# Patient Record
Sex: Female | Born: 1990 | Race: White | Hispanic: No | Marital: Single | Smoking: Current every day smoker
Health system: Southern US, Community
[De-identification: ages and names within clinical notes are randomized; demographics above are authoritative.]

## PROBLEM LIST (undated history)

## (undated) HISTORY — PX: HERNIA REPAIR: SHX51

## (undated) HISTORY — PX: TONSILLECTOMY: SUR1361

## (undated) HISTORY — PX: EYE SURGERY: SHX253

---

## 2011-06-13 ENCOUNTER — Emergency Department: Payer: Self-pay | Admitting: Emergency Medicine

## 2017-01-16 ENCOUNTER — Encounter: Payer: Self-pay | Admitting: Emergency Medicine

## 2017-01-16 ENCOUNTER — Ambulatory Visit
Admission: EM | Admit: 2017-01-16 | Discharge: 2017-01-16 | Disposition: A | Discharge status: Home / Self Care | Payer: Self-pay | Attending: Emergency Medicine | Admitting: Emergency Medicine

## 2017-01-16 DIAGNOSIS — J069 Acute upper respiratory infection, unspecified: Secondary | ICD-10-CM

## 2017-01-16 DIAGNOSIS — J9801 Acute bronchospasm: Secondary | ICD-10-CM

## 2017-01-16 MED ORDER — DEXAMETHASONE 4 MG PO TABS: ORAL_TABLET | ORAL | 0 refills | Status: DC

## 2017-01-16 MED ORDER — IBUPROFEN 800 MG PO TABS: 800.0000 mg | ORAL_TABLET | Freq: Three times a day (TID) | ORAL | 0 refills | Status: DC

## 2017-01-16 MED ORDER — ALBUTEROL SULFATE HFA 108 (90 BASE) MCG/ACT IN AERS: 1.0000 | INHALATION_SPRAY | Freq: Four times a day (QID) | RESPIRATORY_TRACT | 0 refills | Status: DC | PRN

## 2017-01-16 MED ORDER — GUAIFENESIN-CODEINE 100-10 MG/5ML PO SYRP: 10.0000 mL | ORAL_SOLUTION | Freq: Four times a day (QID) | ORAL | 0 refills | Status: DC | PRN

## 2017-01-16 MED ORDER — AEROCHAMBER PLUS MISC: 2 refills | Status: DC

## 2017-01-16 MED ORDER — FLUTICASONE PROPIONATE 50 MCG/ACT NA SUSP: 2.0000 | Freq: Every day | NASAL | 0 refills | Status: DC

## 2017-01-16 NOTE — Discharge Instructions (Signed)
Take two puffs from your albuterol inhaler every 4 hours. Finish the steroids unless your doctor tells you to stop. You may take tylenol 1 gram up to 3 times a day as needed for pain. This with 800 mg of motrin is an effective combination for pain and fever. Make sure you drink extra fluids.   Go to www.goodrx.com to look up your medications. This will give you a list of where you can find your prescriptions at the most affordable prices.

## 2017-01-16 NOTE — ED Provider Notes (Signed)
HPI  SUBJECTIVE:  Kaitlyn Buckley is a 26 y.o. female who presents with wheezing chest tightness, shortness of breath. States her chest hurts secondary to the cough. States that she has a cough productive of whitish clear sputum for the past 4 days. She reports sore throat secondary to the cough. She denies nasal congestion, postnasal drip. She states that she has rhinorrhea with a cough. She states that she feels feverish but she has no documented fevers at home. She is some body aches patient had a headache which resolved last night. She reports 2 episodes of posttussive emesis last night. States that she is unable to sleep at night secondary to cough. She works at [employer] and is not sure if she has had any contacts with the flu. She did not get a flu shot this year. No antipyretic in the past 6-8 hours. She tried ibuprofen with improvement in her symptoms. She also tried Mucinex. Symptoms are worse when she "feels hot". She denies neck stiffness, photophobia, no abdominal pain and urinary complaints here pain, sinus pain or pressure current skin infection. She reports a nonpainful, nonpruritic, faint facial rash but denies rash elsewhere. 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 She is a smoker with a six-year pack year history of smoking. No history of asthma, emphysema, COPD. She has history of weakness/ cerebral palsy in the right side. she is status post tonsillectomy. No history diabetes, hypertension, and a couple minus, kidney disease, cardiac disease. LMP: 3 weeks ago. PMD: None.    History reviewed. No pertinent past medical history.  Past Surgical History:  Procedure Laterality Date  . EYE SURGERY    . HERNIA REPAIR    . TONSILLECTOMY      History reviewed. No pertinent family history.  Social History  Substance Use Topics  . Smoking status: Current Every Day Smoker    Types: Cigarettes  . Smokeless tobacco: Never Used  . Alcohol use Yes    No  current facility-administered medications for this encounter.   Current Outpatient Prescriptions:  .  albuterol (PROVENTIL HFA;VENTOLIN HFA) 108 (90 Base) MCG/ACT inhaler, Inhale 1-2 puffs into the lungs every 6 (six) hours as needed for wheezing or shortness of breath., Disp: 1 Inhaler, Rfl: 0 .  dexamethasone (DECADRON) 4 MG tablet, 4 tablets (16 mg) po at once on day 1 and 4 tabs (16 mg) po at once on day 2, Disp: 8 tablet, Rfl: 0 .  fluticasone (FLONASE) 50 MCG/ACT nasal spray, Place 2 sprays into both nostrils daily., Disp: 16 g, Rfl: 0 .  guaiFENesin-codeine (CHERATUSSIN AC) 100-10 MG/5ML syrup, Take 10 mLs by mouth 4 (four) times daily as needed for cough., Disp: 120 mL, Rfl: 0 .  ibuprofen (ADVIL,MOTRIN) 800 MG tablet, Take 1 tablet (800 mg total) by mouth 3 (three) times daily., Disp: 30 tablet, Rfl: 0 .  Spacer/Aero-Holding Chambers (AEROCHAMBER PLUS) inhaler, Use as instructed, Disp: 1 each, Rfl: 2  No Known Allergies   ROS  As noted in HPI.   Physical Exam  BP (!) 145/90 (BP Location: Left Arm)   Pulse (!) 106   Temp 99.7 F (37.6 C) (Oral)   Resp 16   Ht 5' (1.524 m)   Wt 170 lb (77.1 kg)   LMP 12/30/2016 (Approximate)   SpO2 99%   BMI 33.20 kg/m   Constitutional: Well developed, well nourished, no acute distress Eyes: PERRL, EOMI, conjunctiva normal bilaterally HENT: Normocephalic, atraumatic,mucus membranes moist. TMs  normal bilaterally. Positive clear rhinorrhea with erythematous, swollen turbinates. Positive cobblestoning, postnasal drip. Tonsils surgically absent. Uvula midline, normal size.  Respiratory: Fair air movement, diffuse expiratory wheezing throughout all lung fields. No rales, rhonchi Cardiovascular: Mild regular tachycardia, no murmurs, no gallops, no rubs GI: nondistended,  skin: No rash, skin intact Musculoskeletal: , no deformities Neurologic: Alert & oriented x 3, CN II-XII grossly intact, no motor deficits, sensation grossly  intact Psychiatric: Speech and behavior appropriate   ED Course   Medications - No data to display  No orders of the defined types were placed in this encounter.  No results found for this or any previous visit (from the past 24 hour(s)). No results found.  ED Clinical Impression  Upper respiratory tract infection, unspecified type  Bronchospasm   ED Assessment/Plan  Presentation most consistent with an upper respiratory infection with resulting bronchospasm. Suspect that she has a component of asthma or COPD given the history of "bad lungs" in childhood and her smoking. She is out of the window for Tamiflu. Do not think that she needs an x-ray today. She is afebrile, has no focal lung findings, satting well on room air. She is in no respiratory distress. Plan to send home with albuterol with spacer, 16 mg of dexamethasone by mouth for 2 days to treat a presumed bronchospasm/asthma exacerbation, ibuprofen 800 mg as needed for body aches, Cheratussin, Flonase. Will provide primary care referral. Also writing work note for today and tomorrow. Discussed MDM, plan and followup with patient and  parent. Discussed sn/sx that should prompt return to the UC or ER. Patient agrees with plan.   Meds ordered this encounter  Medications  . Spacer/Aero-Holding Chambers (AEROCHAMBER PLUS) inhaler    Sig: Use as instructed    Dispense:  1 each    Refill:  2  . guaiFENesin-codeine (CHERATUSSIN AC) 100-10 MG/5ML syrup    Sig: Take 10 mLs by mouth 4 (four) times daily as needed for cough.    Dispense:  120 mL    Refill:  0  . fluticasone (FLONASE) 50 MCG/ACT nasal spray    Sig: Place 2 sprays into both nostrils daily.    Dispense:  16 g    Refill:  0  . albuterol (PROVENTIL HFA;VENTOLIN HFA) 108 (90 Base) MCG/ACT inhaler    Sig: Inhale 1-2 puffs into the lungs every 6 (six) hours as needed for wheezing or shortness of breath.    Dispense:  1 Inhaler    Refill:  0  . ibuprofen (ADVIL,MOTRIN)  800 MG tablet    Sig: Take 1 tablet (800 mg total) by mouth 3 (three) times daily.    Dispense:  30 tablet    Refill:  0  . dexamethasone (DECADRON) 4 MG tablet    Sig: 4 tablets (16 mg) po at once on day 1 and 4 tabs (16 mg) po at once on day 2    Dispense:  8 tablet    Refill:  0    *This clinic note was created using Scientist, clinical (histocompatibility and immunogenetics)Dragon dictation software. Therefore, there may be occasional mistakes despite careful proofreading.  ?   Domenick GongAshley Kwesi Sangha, MD 01/16/17 [phone]

## 2017-01-16 NOTE — ED Triage Notes (Signed)
Patient c/o cough, chest congestion, bodyaches and fever for the past 3-4 days.

## 2017-01-21 ENCOUNTER — Encounter: Payer: Self-pay | Admitting: Gynecology

## 2017-01-21 ENCOUNTER — Ambulatory Visit
Admission: EM | Admit: 2017-01-21 | Discharge: 2017-01-21 | Disposition: A | Discharge status: Home / Self Care | Payer: Self-pay | Attending: Family Medicine | Admitting: Family Medicine

## 2017-01-21 DIAGNOSIS — J0100 Acute maxillary sinusitis, unspecified: Secondary | ICD-10-CM

## 2017-01-21 DIAGNOSIS — K0889 Other specified disorders of teeth and supporting structures: Secondary | ICD-10-CM

## 2017-01-21 MED ORDER — AMOXICILLIN-POT CLAVULANATE 875-125 MG PO TABS: 1.0000 | ORAL_TABLET | Freq: Two times a day (BID) | ORAL | 0 refills | Status: DC

## 2017-01-21 NOTE — ED Triage Notes (Signed)
Patient c/o right ear pain/ headache/ tooth sensitively x 3 days.

## 2017-01-21 NOTE — ED Provider Notes (Signed)
[facility] ____________________________________________  Time seen: Approximately 4:10 PM  I have reviewed the triage vital signs and the nursing notes.   HISTORY  Chief Complaint URI   HPI Kaitlyn Buckley is a 26 y.o. female  presenting with mother at bedside for the complaints of right upper dental pain and right ear pain. Patient reports that she recently had upper respiratory infection in which treated with cough medicines and inhalers as well as prednisone. Patient reports cough and congestion better, but reports she has continued to have sinus pressure and postnasal sinus drainage. Patient reports the last 3 days she has also had intermittent right upper tooth discomfort that radiates to right ear. Denies grinding teeth. Denies trauma. Denies fevers. Reports overall continues to eat and drink well. Denies recent antibiotic use.Denies fevers.  Denies chest pain, shortness of breath, abdominal pain, dysuria, paresthesias, extremity pain, extremity swelling or rash. Denies recent antibiotic use.   Patient's last menstrual period was 12/30/2016 (approximate). Denies pregnancy.  History reviewed. No pertinent past medical history. 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 Denies renal insufficiency. Denies cardiac history or diabetes. There are no active problems to display for this patient.   Past Surgical History:  Procedure Laterality Date  . EYE SURGERY    . HERNIA REPAIR    . TONSILLECTOMY       No current facility-administered medications for this encounter.   Current Outpatient Prescriptions:  .  albuterol (PROVENTIL HFA;VENTOLIN HFA) 108 (90 Base) MCG/ACT inhaler, Inhale 1-2 puffs into the lungs every 6 (six) hours as needed for wheezing or shortness of breath., Disp: 1 Inhaler, Rfl: 0 .  fluticasone (FLONASE) 50 MCG/ACT nasal spray, Place 2 sprays into both nostrils daily.,  Disp: 16 g, Rfl: 0 .  ibuprofen (ADVIL,MOTRIN) 800 MG tablet, Take 1 tablet (800 mg total) by mouth 3 (three) times daily., Disp: 30 tablet, Rfl: 0 .  Spacer/Aero-Holding Chambers (AEROCHAMBER PLUS) inhaler, Use as instructed, Disp: 1 each, Rfl: 2 .  amoxicillin-clavulanate (AUGMENTIN) 875-125 MG tablet, Take 1 tablet by mouth every 12 (twelve) hours., Disp: 20 tablet, Rfl: 0 .  dexamethasone (DECADRON) 4 MG tablet, 4 tablets (16 mg) po at once on day 1 and 4 tabs (16 mg) po at once on day 2, Disp: 8 tablet, Rfl: 0 .  guaiFENesin-codeine (CHERATUSSIN AC) 100-10 MG/5ML syrup, Take 10 mLs by mouth 4 (four) times daily as needed for cough., Disp: 120 mL, Rfl: 0  Allergies Patient has no known allergies.  No family history on file.  Social History Social History  Substance Use Topics  . Smoking status: Current Every Day Smoker    Types: Cigarettes  . Smokeless tobacco: Never Used  . Alcohol use Yes    Review of Systems Constitutional: No fever/chills Eyes: No visual changes. ENT: Has occasional mild sore throat. Cardiovascular: Denies chest pain. Respiratory: Denies shortness of breath. Gastrointestinal: No abdominal pain.  No nausea, no vomiting.  No diarrhea.  No constipation. Genitourinary: Negative for dysuria. Musculoskeletal: Negative for back pain. Skin: Negative for rash. Neurological: Negative for headaches, focal weakness or numbness.  10-point ROS otherwise negative.  ____________________________________________   PHYSICAL EXAM:  VITAL SIGNS: ED Triage Vitals  Enc Vitals Group     BP 01/21/17 1520 (!) 145/91     Pulse Rate 01/21/17 1520 79     Resp 01/21/17 1520 16     Temp 01/21/17 1520 98.2 F (36.8 C)  Temp Source 01/21/17 1520 Oral     SpO2 01/21/17 1520 98 %     Weight 01/21/17 1522 180 lb (81.6 kg)     Height --      Head Circumference --      Peak Flow --      Pain Score 01/21/17 1524 9     Pain Loc --      Pain Edu? --      Excl. in GC? --      Constitutional: Alert and oriented. Well appearing and in no acute distress. Eyes: Conjunctivae are normal. PERRL. EOMI. Head: Atraumatic.Mild to moderate tenderness to palpation bilateral maxillary sinuses, right greater than left. No frontal sinuses palpation. No swelling. No erythema. No facial swelling noted. No facial paresthesias. No trismus.  Ears: no erythema, normal TMs bilaterally. No surrounding tenderness, swelling or erythema bilaterally.  Nose: No nasal congestion or rhinorrhea.  Mouth/Throat: Mucous membranes are moist.  Oropharynx non-erythematous.No tonsillar swelling or exudate. Right upper wisdom tooth with surrounding mild to moderate tenderness to palpation and mild gum line swelling, no palpated or visualized abscess, no drainage. No trismus. No visualized cary or dental fracture. Neck: No stridor.  No cervical spine tenderness to palpation. Hematological/Lymphatic/Immunilogical: No cervical lymphadenopathy. Cardiovascular: Normal rate, regular rhythm. Grossly normal heart sounds.  Good peripheral circulation. Respiratory: Normal respiratory effort.  No retractions. No wheezes, rales or rhonchi. Good air movement.  Gastrointestinal: Soft and nontender.  No CVA tenderness. Musculoskeletal: No lower or upper extremity tenderness nor edema. No cervical, thoracic or lumbar tenderness to palpation.  Neurologic:  Normal speech and language. No gait instability. Skin:  Skin is warm, dry and intact. No rash noted. Psychiatric: Mood and affect are normal. Speech and behavior are normal.  ___________________________________________   LABS (all labs ordered are listed, but only abnormal results are displayed)  Labs Reviewed - No data to display   PROCEDURES Procedures   ____________________________________________   INITIAL IMPRESSION / ASSESSMENT AND PLAN / ED COURSE  Pertinent labs & imaging results that were available during my care of the patient were reviewed by me  and considered in my medical decision making (see chart for details).  Well-appearing patient. No acute distress. Patient with recent upper respiratory infection symptoms, with sinus tenderness, report of sinus drainage as well as right upper dental tenderness. Discussed with patient concern for secondary infection. Will treat patient with oral Augmentin. Encouraged supportive care, follow with primary care physician or Ear Nose Throat this week. Discussed rest and fluids, patient states she'll take over-the-counter ibuprofen as needed. Discussed indication, risks and benefits of medications with patient.  Discussed follow up with Primary care physician this week. Discussed follow up and return parameters including no resolution or any worsening concerns. Patient verbalized understanding and agreed to plan.   ____________________________________________   FINAL CLINICAL IMPRESSION(S) / ED DIAGNOSES  Final diagnoses:  Pain, dental  Acute maxillary sinusitis, recurrence not specified     Discharge Medication List as of 01/21/2017  4:02 PM    START taking these medications   Details  amoxicillin-clavulanate (AUGMENTIN) 875-125 MG tablet Take 1 tablet by mouth every 12 (twelve) hours., Starting Sat 01/21/2017, Normal        Note: This dictation was prepared with Dragon dictation along with smaller phrase technology. Any transcriptional errors that result from this process are unintentional.         Renford Dills, NP 01/21/17 1817

## 2017-01-21 NOTE — Discharge Instructions (Signed)
Take medication as prescribed. Rest. Drink plenty of fluids.   Follow up with your primary care physician or Ear, Nose and throat this week. Return to Urgent care or Emergency room for new or worsening concerns.

## 2017-06-14 ENCOUNTER — Ambulatory Visit
Admission: EM | Admit: 2017-06-14 | Discharge: 2017-06-14 | Disposition: A | Discharge status: Home / Self Care | Payer: Self-pay | Attending: Family Medicine | Admitting: Family Medicine

## 2017-06-14 DIAGNOSIS — L03116 Cellulitis of left lower limb: Secondary | ICD-10-CM

## 2017-06-14 DIAGNOSIS — L818 Other specified disorders of pigmentation: Secondary | ICD-10-CM

## 2017-06-14 MED ORDER — CEFTRIAXONE SODIUM 1 G IJ SOLR
1.0000 g | Freq: Once | INTRAMUSCULAR | Status: AC
Start: 2017-06-14 — End: 2017-06-14
  Administered 2017-06-14: 1 g via INTRAMUSCULAR

## 2017-06-14 MED ORDER — SULFAMETHOXAZOLE-TRIMETHOPRIM 800-160 MG PO TABS: 1.0000 | ORAL_TABLET | Freq: Two times a day (BID) | ORAL | 0 refills | Status: AC

## 2017-06-14 MED ORDER — MUPIROCIN 2 % EX OINT: TOPICAL_OINTMENT | CUTANEOUS | 0 refills | Status: DC

## 2017-06-14 MED ORDER — TETANUS-DIPHTH-ACELL PERTUSSIS 5-2.5-18.5 LF-MCG/0.5 IM SUSP
0.5000 mL | Freq: Once | INTRAMUSCULAR | Status: AC
  Administered 2017-06-14: 0.5 mL via INTRAMUSCULAR

## 2017-06-14 MED ORDER — TETANUS-DIPHTHERIA TOXOIDS TD 5-2 LFU IM INJ: 0.5000 mL | INJECTION | Freq: Once | INTRAMUSCULAR | Status: DC

## 2017-06-14 NOTE — ED Triage Notes (Signed)
[age] year old Caucasian female is here with her mother with complaints of tattoo infection on her left lower leg. Patient states she tattoo herself on 06/01/17. She states she started noticing the area was getting worse yesterday when she noticed pus oozing from it. She states she has been using Hydrogen Peroxide as well. She states the black ink came off 4 days ago. She states she feels like she went to deep with the tattooing.

## 2017-06-14 NOTE — Discharge Instructions (Signed)
Take medication as prescribed. Keep clean. Drink plenty of fluids.  ° °Follow up with your primary care physician this week as needed. Return to Urgent care for new or worsening concerns.  ° °

## 2017-06-14 NOTE — ED Provider Notes (Signed)
[facility] ____________________________________________  Time seen: Approximately 11:12 AM  I have reviewed the triage vital signs and the nursing notes.   HISTORY  Chief Complaint Cellulitis   HPI Kaitlyn CASARES is a 26 y.o. female presents for evaluation of left lower leg possible infection. Patient reports on 06/01/2017 she used a tattoo pen to tattoo herself. Patient reports initially this was going well. Reports about 4 or 5 days ago the area began to have some skin, off, use and become red. States had some drainage yesterday. Denies drainage today. States mild tender directly at site of tattoo, denies any other pain or swelling. Denies any paresthesias, decreased range of motion or injury otherwise. Unsure of last tetanus immunization. States tried some over-the-counter topical antibiotic without any changes. Denies fevers. Reports continues to eat and drink well and has continued to remain active. Denies other complaints. Denies aggravating or alleviating factors. Denies other alleviating measures taken.Denies other skin changes. Denies insect bite or tick bite or attachment.  Denies chest pain, shortness of breath. Denies recent sickness. Denies recent antibiotic use. Denies MRSA history or skin infection history.  Patient's last menstrual period was 06/01/2017. Denies pregnancy   History reviewed. No pertinent past medical history. denies There are no active problems to display for this patient.   Past Surgical History:  Procedure Laterality Date  . EYE SURGERY    . HERNIA REPAIR    . TONSILLECTOMY        Current Facility-Administered Medications:  .  cefTRIAXone (ROCEPHIN) injection 1 g, 1 g, Intramuscular, Once, Renford Dills, NP .  Tdap (BOOSTRIX) injection 0.5 mL, 0.5 mL, Intramuscular, Once, Renford Dills, NP  Current Outpatient Prescriptions:  .  ibuprofen (ADVIL,MOTRIN) 800 MG tablet, Take 1 tablet (800 mg total) by mouth 3 (three) times  daily., Disp: 30 tablet, Rfl: 0 .  albuterol (PROVENTIL HFA;VENTOLIN HFA) 108 (90 Base) MCG/ACT inhaler, Inhale 1-2 puffs into the lungs every 6 (six) hours as needed for wheezing or shortness of breath., Disp: 1 Inhaler, Rfl: 0 .  fluticasone (FLONASE) 50 MCG/ACT nasal spray, Place 2 sprays into both nostrils daily., Disp: 16 g, Rfl: 0 .  mupirocin ointment (BACTROBAN) 2 %, Apply three times a day for 7 days., Disp: 22 g, Rfl: 0 .  Spacer/Aero-Holding Chambers (AEROCHAMBER PLUS) inhaler, Use as instructed, Disp: 1 each, Rfl: 2 .  sulfamethoxazole-trimethoprim (BACTRIM DS,SEPTRA DS) 800-160 MG tablet, Take 1 tablet by mouth 2 (two) times daily., Disp: 20 tablet, Rfl: 0  Allergies Patient has no known allergies.  History reviewed. No pertinent family history.  Social History Social History  Substance Use Topics  . Smoking status: Current Every Day Smoker    Types: Cigarettes  . Smokeless tobacco: Current User  . Alcohol use Yes     Comment: occ    Review of Systems Constitutional: No fever/chills Eyes: No visual changes. ENT: No sore throat. Cardiovascular: Denies chest pain. Respiratory: Denies shortness of breath. Gastrointestinal: No abdominal pain.  No nausea, no vomiting.  No diarrhea.  No constipation. Genitourinary: Negative for dysuria. Musculoskeletal: Negative for back pain. Skin: As above.  ____________________________________________   PHYSICAL EXAM:  VITAL SIGNS: ED Triage Vitals  Enc Vitals Group     BP 06/14/17 1036 (!) 151/95     Pulse Rate 06/14/17 1036 85     Resp 06/14/17 1036 16     Temp 06/14/17 1036 98.2 F (36.8 C)     Temp Source 06/14/17 1036 Oral     SpO2  06/14/17 1036 100 %     Weight 06/14/17 1040 180 lb (81.6 kg)     Height 06/14/17 1040 4' 10.5" (1.486 m)     Head Circumference --      Peak Flow --      Pain Score 06/14/17 1040 4     Pain Loc --      Pain Edu? --      Excl. in GC? --     Constitutional: Alert and oriented. Well  appearing and in no acute distress.  Cardiovascular: Normal rate, regular rhythm. Grossly normal heart sounds.  Good peripheral circulation. Respiratory: Normal respiratory effort without tachypnea nor retractions. Breath sounds are clear and equal bilaterally. No wheezes, rales, rhonchi. Musculoskeletal:  Steady gait. Neurologic:  Normal speech and language. Speech is normal. No gait instability.  Skin:  Skin is warm, dry. Except: left medial lower ankle appearance of heart shaped tattoo with crusted centered lesions with minimal honey colored crusting, no active drainage, no fluctuance, no induration, mild to moderate immediate surrounding erythema, no further Surrounding erythema, erythema non circumferential, minimal tenderness to palpation at wound site, left lower extremity otherwise nontender, left foot normal sensation and distal pedal pulses, no calf tenderness or other skin changes noted to left leg.Ambulatory with steady gait. Psychiatric: Mood and affect are normal. Speech and behavior are normal. Patient exhibits appropriate insight and judgment   ___________________________________________   LABS (all labs ordered are listed, but only abnormal results are displayed)  Labs Reviewed - No data to display  RADIOLOGY  No results found. ____________________________________________   PROCEDURES Procedures    INITIAL IMPRESSION / ASSESSMENT AND PLAN / ED COURSE  Pertinent labs & imaging results that were available during my care of the patient were reviewed by me and considered in my medical decision making (see chart for details).  Appearing patient. No acute distress. Patient recently tried to self tattoo at home now with appearance of secondary skin infection. Unsure of last tetanus immunization, tetanus given. Suspect localized cellulitis from tattoo. 1 g IM Rocephin given once in urgent care. Will treat with topical Bactroban and oral Bactrim. Encouraged supportive care,  wound maintenance and close monitoring.Discussed indication, risks and benefits of medications with patient.  Discussed follow up with Primary care physician this week. Discussed follow up and return parameters including no resolution or any worsening concerns. Patient verbalized understanding and agreed to plan.   ____________________________________________   FINAL CLINICAL IMPRESSION(S) / ED DIAGNOSES  Final diagnoses:  Cellulitis of left lower leg  Tattoo of skin     New Prescriptions   MUPIROCIN OINTMENT (BACTROBAN) 2 %    Apply three times a day for 7 days.   SULFAMETHOXAZOLE-TRIMETHOPRIM (BACTRIM DS,SEPTRA DS) 800-160 MG TABLET    Take 1 tablet by mouth 2 (two) times daily.    Note: This dictation was prepared with Dragon dictation along with smaller phrase technology. Any transcriptional errors that result from this process are unintentional.         Renford DillsMiller, Jacinta Penalver, NP 06/14/17 1158

## 2018-04-09 ENCOUNTER — Other Ambulatory Visit: Payer: Self-pay

## 2018-04-09 ENCOUNTER — Ambulatory Visit
Admission: EM | Admit: 2018-04-09 | Discharge: 2018-04-09 | Disposition: A | Discharge status: Home / Self Care | Payer: Self-pay | Attending: Family Medicine | Admitting: Family Medicine

## 2018-04-09 DIAGNOSIS — K529 Noninfective gastroenteritis and colitis, unspecified: Secondary | ICD-10-CM

## 2018-04-09 MED ORDER — ONDANSETRON 4 MG PO TBDP: 4.0000 mg | ORAL_TABLET | Freq: Three times a day (TID) | ORAL | 0 refills | Status: AC | PRN

## 2018-04-09 NOTE — ED Triage Notes (Signed)
Pt with nausea and vomited some yesterday. No vomiting today. Diarrhea started last pm. And is watery. Feels bloated. States something similar happened a month ago but resolved on its own. Last meal before the sx started was beef tips, lima beans, and mashed potatoes. Body feels achy 7/10

## 2018-04-09 NOTE — ED Provider Notes (Signed)
[facility]    CSN: [id number] Arrival date & time: 04/09/18  1428   History   Chief Complaint Chief Complaint  Patient presents with  . Diarrhea   HPI  27 year old female presents with nausea, vomiting, diarrhea.  Started yesterday.  Began with nausea and vomiting.  Vomiting has now resolved.  She is now bothered by diarrhea & crampy abdominal pain, body aches.  Watery.  Associated bloating.  Diarrhea is improving and the stool is becoming more formed.  No fever documented, however she has felt feverish.  She is able to drink but has not eaten very much.  No known exacerbating factors.  No other associated symptoms.  No other complaints.   PMH: Obesity, Tobacco abuse.  Past Surgical History:  Procedure Laterality Date  . EYE SURGERY    . HERNIA REPAIR    . TONSILLECTOMY     OB History   None    Home Medications    Prior to Admission medications   Medication Sig Start Date End Date Taking? Authorizing Provider  albuterol (PROVENTIL HFA;VENTOLIN HFA) 108 (90 Base) MCG/ACT inhaler Inhale 1-2 puffs into the lungs every 6 (six) hours as needed for wheezing or shortness of breath. 01/16/17   Domenick Gong, MD  ondansetron (ZOFRAN-ODT) 4 MG disintegrating tablet Take 1 tablet (4 mg total) by mouth every 8 (eight) hours as needed for nausea or vomiting. 04/09/18   Tommie Sams, DO   Family History History reviewed. No pertinent family history.  Social History Social History   Tobacco Use  . Smoking status: Current Every Day Smoker    Packs/day: 1.00    Types: Cigarettes  . Smokeless tobacco: Never Used  Substance Use Topics  . Alcohol use: Yes    Comment: occ  . Drug use: No   Allergies   Amoxicillin  Review of Systems Review of Systems  Constitutional: Positive for appetite change. Negative for fever.  Gastrointestinal: Positive for abdominal pain, diarrhea, nausea and vomiting.   Physical Exam Triage Vital Signs ED Triage Vitals  Enc Vitals  Group     BP 04/09/18 1450 (!) 144/97     Pulse Rate 04/09/18 1450 (!) 115     Resp 04/09/18 1450 20     Temp 04/09/18 1450 98.5 F (36.9 C)     Temp Source 04/09/18 1450 Oral     SpO2 04/09/18 1450 100 %     Weight 04/09/18 1446 165 lb (74.8 kg)     Height 04/09/18 1446  (1.499 m)     Head Circumference --      Peak Flow --      Pain Score 04/09/18 1446 7     Pain Loc --      Pain Edu? --      Excl. in GC? --    Updated Vital Signs BP (!) 144/97 (BP Location: Left Arm)   Pulse (!) 115   Temp 98.5 F (36.9 C) (Oral)   Resp 20   Ht  (1.499 m)   Wt 165 lb (74.8 kg)   LMP 03/26/2018   SpO2 100%   BMI 33.33 kg/m   Physical Exam  Constitutional: She is oriented to person, place, and time. She appears well-developed. No distress.  HENT:  Mouth/Throat: Oropharynx is clear and moist.  Cardiovascular: Regular rhythm.  Tachycardic.  Pulmonary/Chest: Effort normal and breath sounds normal.  Abdominal: Soft. She exhibits no distension.  Diffusely tender to palpation (mild).  Neurological: She is alert  and oriented to person, place, and time.  Psychiatric: She has a normal mood and affect. Her behavior is normal.  Nursing note and vitals reviewed.  UC Treatments / Results  Labs (all labs ordered are listed, but only abnormal results are displayed) Labs Reviewed - No data to display  EKG None  Radiology No results found.  Procedures Procedures (including critical care time)  Medications Ordered in UC Medications - No data to display  Initial Impression / Assessment and Plan / UC Course  I have reviewed the triage vital signs and the nursing notes.  Pertinent labs & imaging results that were available during my care of the patient were reviewed by me and considered in my medical decision making (see chart for details).    27 year old female presents with viral gastroenteritis. Treating with Zofran. Rest, fluids. Work note given.    Final Clinical  Impressions(s) / UC Diagnoses   Final diagnoses:  Gastroenteritis   Discharge Instructions   None    ED Prescriptions    Medication Sig Dispense Auth. Provider   ondansetron (ZOFRAN-ODT) 4 MG disintegrating tablet Take 1 tablet (4 mg total) by mouth every 8 (eight) hours as needed for nausea or vomiting. 20 tablet Tommie Sams, DO     Controlled Substance Prescriptions [place] Controlled Substance Registry consulted? Not Applicable   Tommie Sams, DO 04/09/18 1548

## 2019-04-10 ENCOUNTER — Other Ambulatory Visit: Payer: Self-pay

## 2019-04-10 ENCOUNTER — Emergency Department: Payer: HRSA Program

## 2019-04-10 ENCOUNTER — Emergency Department
Admission: EM | Admit: 2019-04-10 | Discharge: 2019-04-10 | Disposition: A | Discharge status: Home / Self Care | Payer: HRSA Program | Attending: Emergency Medicine | Admitting: Emergency Medicine

## 2019-04-10 DIAGNOSIS — J4521 Mild intermittent asthma with (acute) exacerbation: Secondary | ICD-10-CM | POA: Diagnosis not present

## 2019-04-10 DIAGNOSIS — F17210 Nicotine dependence, cigarettes, uncomplicated: Secondary | ICD-10-CM | POA: Insufficient documentation

## 2019-04-10 DIAGNOSIS — R6889 Other general symptoms and signs: Secondary | ICD-10-CM

## 2019-04-10 DIAGNOSIS — Z20828 Contact with and (suspected) exposure to other viral communicable diseases: Secondary | ICD-10-CM | POA: Diagnosis not present

## 2019-04-10 DIAGNOSIS — R059 Cough, unspecified: Secondary | ICD-10-CM

## 2019-04-10 DIAGNOSIS — R05 Cough: Secondary | ICD-10-CM | POA: Diagnosis present

## 2019-04-10 DIAGNOSIS — Z20822 Contact with and (suspected) exposure to covid-19: Secondary | ICD-10-CM

## 2019-04-10 MED ORDER — ALBUTEROL SULFATE HFA 108 (90 BASE) MCG/ACT IN AERS: 1.0000 | INHALATION_SPRAY | Freq: Four times a day (QID) | RESPIRATORY_TRACT | 0 refills | Status: DC | PRN

## 2019-04-10 NOTE — Discharge Instructions (Signed)
Follow-up with your regular doctor if not better in 3 days.  Your COVID-19 test will not be back for a few days.  You should remain out of work until you receive a negative result.  Return to the emergency department if you are worsening.

## 2019-04-10 NOTE — ED Provider Notes (Signed)
[facility] Emergency Department Provider Note  ____________________________________________   First MD Initiated Contact with Patient 04/10/19 1647     (approximate)  I have reviewed the triage vital signs and the nursing notes.   HISTORY  Chief Complaint Cough    HPI Kaitlyn Buckley is a 28 y.o. female presents emergency department stating I just need a work note.  She states that she was sent home from work because of her dry cough.  Patient works at [employer].  She states she has been coughing for about 4 days.  And some wheezing.  Some diarrhea but states she always has diarrhea.  She denies any known fever or chills.    History reviewed. No pertinent past medical history.  There are no active problems to display for this patient.   Past Surgical History:  Procedure Laterality Date  . EYE SURGERY    . HERNIA REPAIR    . TONSILLECTOMY      Prior to Admission medications   Medication Sig Start Date End Date Taking? Authorizing Provider  albuterol (VENTOLIN HFA) 108 (90 Base) MCG/ACT inhaler Inhale 1-2 puffs into the lungs every 6 (six) hours as needed for wheezing or shortness of breath. 04/10/19   Angeletta Goelz, Roselyn Bering, PA-C  ondansetron (ZOFRAN-ODT) 4 MG disintegrating tablet Take 1 tablet (4 mg total) by mouth every 8 (eight) hours as needed for nausea or vomiting. 04/09/18   Tommie Sams, DO    Allergies Amoxicillin  History reviewed. No pertinent family history.  Social History Social History   Tobacco Use  . Smoking status: Current Every Day Smoker    Packs/day: 1.00    Types: Cigarettes  . Smokeless tobacco: Never Used  Substance Use Topics  . Alcohol use: Yes    Comment: occ  . Drug use: No    Review of Systems  Constitutional: No fever/chills Eyes: No visual changes. ENT: No sore throat. Respiratory: Positive cough and wheezing Genitourinary: Negative for dysuria. Musculoskeletal: Negative for back pain. Skin: Negative  for rash.    ____________________________________________   PHYSICAL EXAM:  VITAL SIGNS: ED Triage Vitals  Enc Vitals Group     BP 04/10/19 1540 (!) 144/92     Pulse Rate 04/10/19 1540 (!) 110     Resp 04/10/19 1540 18     Temp 04/10/19 1540 98.5 F (36.9 C)     Temp Source 04/10/19 1540 Oral     SpO2 04/10/19 1540 98 %     Weight 04/10/19 1538 150 lb (68 kg)     Height 04/10/19 1538 5' (1.524 m)     Head Circumference --      Peak Flow --      Pain Score 04/10/19 1538 0     Pain Loc --      Pain Edu? --      Excl. in GC? --     Constitutional: Alert and oriented. Well appearing and in no acute distress. Eyes: Conjunctivae are normal.  Head: Atraumatic. Nose: No congestion/rhinnorhea. Mouth/Throat: Mucous membranes are moist.   Neck:  supple no lymphadenopathy noted Cardiovascular: Normal rate, regular rhythm. Heart sounds are normal Respiratory: Normal respiratory effort.  No retractions, lungs mild wheezing GU: deferred Musculoskeletal: FROM all extremities, warm and well perfused Neurologic:  Normal speech and language.  Skin:  Skin is warm, dry and intact. No rash noted. Psychiatric: Mood and affect are normal. Speech and behavior are normal.  ____________________________________________   LABS (all labs ordered are  listed, but only abnormal results are displayed)  Labs Reviewed  NOVEL CORONAVIRUS, NAA (HOSPITAL ORDER, SEND-OUT TO REF LAB)   ____________________________________________   ____________________________________________  RADIOLOGY  Chest x-ray is normal  ____________________________________________   PROCEDURES  Procedure(s) performed: No  Procedures    ____________________________________________   INITIAL IMPRESSION / ASSESSMENT AND PLAN / ED COURSE  Pertinent labs & imaging results that were available during my care of the patient were reviewed by me and considered in my medical decision making (see chart for details).    Patient is 28 year old female presents emergency department with cough and wheezing.  She was sent home from work which is [employer] for her dry cough.  She states she just needs a note stating that she is well and can go back.  Physical exam shows her to be well but she does have a dry cough with wheezing.  Chest x-ray is normal  COVID-19 test is pending  Explained to the patient that because we are in the pandemic where a dry cough and some shortness of breath are associated with the COVID-19 virus that she should remain home until she has her results.  If her results are negative she may return to work at that time.  However if her results are positive she will have to be quarantined for longer.  She was given a work note stating the same.  She states she understands and will comply.  She was discharged in stable condition.  Kaitlyn BallsHeather L Burkey was evaluated in Emergency Department on 04/10/2019 for the symptoms described in the history of present illness. She was evaluated in the context of the global COVID-19 pandemic, which necessitated consideration that the patient might be at risk for infection with the SARS-CoV-2 virus that causes COVID-19. Institutional protocols and algorithms that pertain to the evaluation of patients at risk for COVID-19 are in a state of rapid change based on information released by regulatory bodies including the CDC and federal and state organizations. These policies and algorithms were followed during the patient's care in the ED.      As part of my medical decision making, I reviewed the following data within the electronic MEDICAL RECORD NUMBER Nursing notes reviewed and incorporated, Old chart reviewed, Radiograph reviewed and was normal, Notes from prior ED visits and [place] Controlled Substance Database  ____________________________________________   FINAL CLINICAL IMPRESSION(S) / ED DIAGNOSES  Final diagnoses:  Cough  Mild intermittent asthma with exacerbation   Suspected Covid-19 Virus Infection      NEW MEDICATIONS STARTED DURING THIS VISIT:  Discharge Medication List as of 04/10/2019  5:01 PM       Note:  This document was prepared using Dragon voice recognition software and may include unintentional dictation errors.    Faythe GheeFisher, Keiyana Stehr W, PA-C 04/10/19 1831    Jene EveryKinner, Robert, MD 04/10/19 [phone]

## 2019-04-10 NOTE — ED Triage Notes (Signed)
Pt got sent home from work for a dry cough. Pt reports having cough about 4 days. Pt has started spitting up foam per pt. Pt endorses some SOB. Denies fevers. Pt endorses diarrhea but states "that's usual". Pt ambulatory to room. Pt states "just need a work note so I can go back to work".

## 2019-04-18 ENCOUNTER — Emergency Department: Admission: EM | Admit: 2019-04-18 | Discharge: 2019-04-18 | Discharge status: Absent without leave | Payer: Self-pay

## 2020-04-03 IMAGING — DX PORTABLE CHEST - 1 VIEW
1 series · 1 of 1 positions shown · non-contrast
Comparison: None.

CLINICAL DATA: Cough

EXAM:
PORTABLE CHEST 1 VIEW

[chest ap]
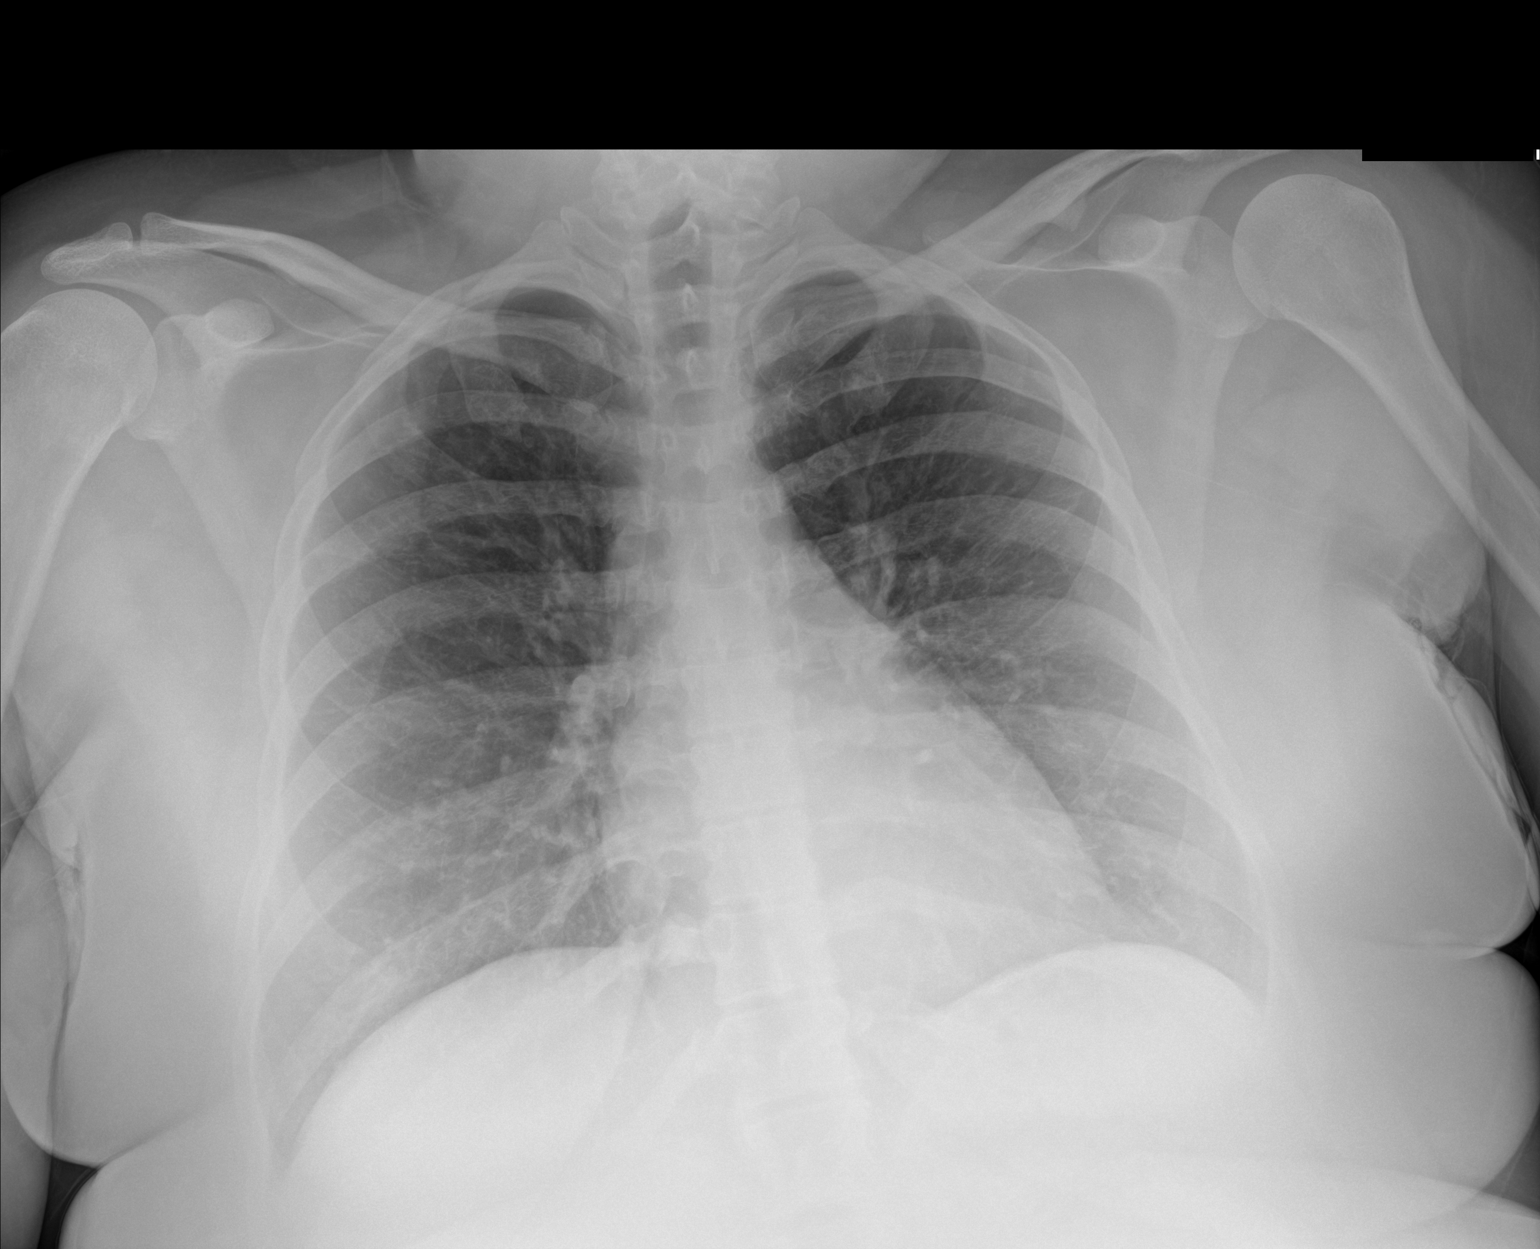

[1 of 1 positions shown; findings below may reference images not displayed]

FINDINGS: The heart size and mediastinal contours are within normal limits.
Both lungs are clear. The visualized skeletal structures are
unremarkable.
IMPRESSION: No acute abnormality of the lungs in AP portable projection. No
focal airspace opacity.

## 2021-11-10 ENCOUNTER — Other Ambulatory Visit: Payer: Self-pay

## 2021-11-10 ENCOUNTER — Ambulatory Visit
Admission: EM | Admit: 2021-11-10 | Discharge: 2021-11-10 | Disposition: A | Discharge status: Home / Self Care | Payer: Self-pay

## 2021-11-10 DIAGNOSIS — R062 Wheezing: Secondary | ICD-10-CM

## 2021-11-10 DIAGNOSIS — J069 Acute upper respiratory infection, unspecified: Secondary | ICD-10-CM

## 2021-11-10 MED ORDER — ALBUTEROL SULFATE HFA 108 (90 BASE) MCG/ACT IN AERS: 1.0000 | INHALATION_SPRAY | Freq: Four times a day (QID) | RESPIRATORY_TRACT | 0 refills | Status: AC | PRN

## 2021-11-10 MED ORDER — DOXYCYCLINE HYCLATE 100 MG PO CAPS: 100.0000 mg | ORAL_CAPSULE | Freq: Two times a day (BID) | ORAL | 0 refills | Status: AC

## 2021-11-10 MED ORDER — IPRATROPIUM BROMIDE 0.06 % NA SOLN: 2.0000 | Freq: Four times a day (QID) | NASAL | 12 refills | Status: AC

## 2021-11-10 NOTE — ED Triage Notes (Signed)
Patient presents to Urgent Care with complaints of nasal congestion x 8 days and right ear pressure x 4 days. Self-treated ear pressure with OTC ear drops, nyquil and rubitussin for the nasal congestion.   Denies fever.

## 2021-11-10 NOTE — Discharge Instructions (Signed)
The Augmentin twice daily with food for 10 days for treatment of your URI.  Perform sinus irrigation 2-3 times a day with a NeilMed sinus rinse kit and distilled water.  Do not use tap water.  You can use plain over-the-counter Mucinex every 6 hours to break up the stickiness of the mucus so your body can clear it.  Increase your oral fluid intake to thin out your mucus so that is also able for your body to clear more easily.  Use the Atrovent nasal spray, 2 squirts in each nostril every 6 hours, as needed for runny nose and postnasal drip.  Use the Albuterol inhaler as needed for wheezing. You can use 2 puffs every 4-6 hours as needed for wheezing.   Return for reevaluation or see your primary care provider for any new or worsening symptoms.   Take an over-the-counter probiotic, such as Culturelle-align-activia, 1 hour after each dose of antibiotic to prevent diarrhea.  If you develop any new or worsening symptoms return for reevaluation or see your primary care provider.

## 2021-11-10 NOTE — ED Provider Notes (Signed)
[facility]    CSN: [id number] Arrival date & time: 11/10/21  1541      History   Chief Complaint Chief Complaint  Patient presents with   Otalgia   Nasal Congestion    HPI Kaitlyn Buckley is a 30 y.o. female.   HPI  30 year old female here for evaluation of respiratory complaints.  Patient reports that for the last 8 days she has been experiencing nasal congestion with a thick yellow nasal discharge.  2 days ago she reports that she started having bloody discharge from her nose when she blows.  Last 4 days she has been experiencing pain in her right ear.  She also endorses an occasional nonproductive cough and wheezing.  At the onset of symptoms she had a sore throat and also some diarrhea but both of those symptoms have resolved.  She denies any fever, shortness of breath, nausea, or vomiting.  History reviewed. No pertinent past medical history.  There are no problems to display for this patient.   Past Surgical History:  Procedure Laterality Date   EYE SURGERY     HERNIA REPAIR     TONSILLECTOMY      OB History   No obstetric history on file.      Home Medications    Prior to Admission medications   Medication Sig Start Date End Date Taking? Authorizing Provider  doxycycline (VIBRAMYCIN) 100 MG capsule Take 1 capsule (100 mg total) by mouth 2 (two) times daily. 11/10/21  Yes Margarette Canada, NP  ipratropium (ATROVENT) 0.06 % nasal spray Place 2 sprays into both nostrils 4 (four) times daily. 11/10/21  Yes Margarette Canada, NP  albuterol (VENTOLIN HFA) 108 (90 Base) MCG/ACT inhaler Inhale 1-2 puffs into the lungs every 6 (six) hours as needed for wheezing or shortness of breath. 11/10/21   Margarette Canada, NP  citalopram (CELEXA) 20 MG tablet Take 20 mg by mouth daily. 10/21/21   [provider]  ondansetron (ZOFRAN-ODT) 4 MG disintegrating tablet Take 1 tablet (4 mg total) by mouth every 8 (eight) hours as needed for nausea or vomiting. 04/09/18    Coral Spikes, DO    Family History History reviewed. No pertinent family history.  Social History Social History   Tobacco Use   Smoking status: Every Day    Packs/day: 1.00    Types: Cigarettes   Smokeless tobacco: Never  Vaping Use   Vaping Use: Never used  Substance Use Topics   Alcohol use: Yes    Comment: occ   Drug use: No     Allergies   Amoxicillin and Clindamycin/lincomycin   Review of Systems Review of Systems  Constitutional:  Negative for activity change, appetite change and fever.  HENT:  Positive for congestion, ear pain, rhinorrhea, sinus pressure and sore throat.   Respiratory:  Positive for cough and wheezing. Negative for shortness of breath.   Gastrointestinal:  Positive for diarrhea. Negative for nausea and vomiting.  Skin:  Negative for rash.  Hematological: Negative.   Psychiatric/Behavioral: Negative.      Physical Exam Triage Vital Signs ED Triage Vitals  Enc Vitals Group     BP 11/10/21 1626 (!) 140/104     Pulse Rate 11/10/21 1626 78     Resp 11/10/21 1626 16     Temp 11/10/21 1626 98.2 F (36.8 C)     Temp Source 11/10/21 1626 Oral     SpO2 11/10/21 1626 99 %     Weight --  Height --      Head Circumference --      Peak Flow --      Pain Score 11/10/21 1624 0     Pain Loc --      Pain Edu? --      Excl. in [place]? --    No data found.  Updated Vital Signs BP (!) 140/104 (BP Location: Left Arm)   Pulse 78   Temp 98.2 F (36.8 C) (Oral)   Resp 16   SpO2 99%   Visual Acuity Right Eye Distance:   Left Eye Distance:   Bilateral Distance:    Right Eye Near:   Left Eye Near:    Bilateral Near:     Physical Exam Vitals and nursing note reviewed.  Constitutional:      General: She is not in acute distress.    Appearance: Normal appearance. She is obese. She is not ill-appearing.  HENT:     Head: Normocephalic and atraumatic.     Right Ear: Tympanic membrane, ear canal and external ear normal. There is no impacted  cerumen.     Left Ear: Tympanic membrane, ear canal and external ear normal. There is no impacted cerumen.     Nose: Congestion and rhinorrhea present.     Mouth/Throat:     Mouth: Mucous membranes are moist.     Pharynx: Oropharynx is clear. No posterior oropharyngeal erythema.  Cardiovascular:     Rate and Rhythm: Normal rate and regular rhythm.     Pulses: Normal pulses.     Heart sounds: Normal heart sounds. No murmur heard.   No gallop.  Pulmonary:     Effort: Pulmonary effort is normal.     Breath sounds: Wheezing present. No rhonchi or rales.  Musculoskeletal:     Cervical back: Normal range of motion and neck supple.  Lymphadenopathy:     Cervical: No cervical adenopathy.  Skin:    General: Skin is warm and dry.     Capillary Refill: Capillary refill takes less than 2 seconds.     Findings: No erythema or rash.  Neurological:     General: No focal deficit present.     Mental Status: She is alert and oriented to person, place, and time.  Psychiatric:        Mood and Affect: Mood normal.        Behavior: Behavior normal.        Thought Content: Thought content normal.        Judgment: Judgment normal.     UC Treatments / Results  Labs (all labs ordered are listed, but only abnormal results are displayed) Labs Reviewed - No data to display  EKG   Radiology No results found.  Procedures Procedures (including critical care time)  Medications Ordered in UC Medications - No data to display  Initial Impression / Assessment and Plan / UC Course  I have reviewed the triage vital signs and the nursing notes.  Pertinent labs & imaging results that were available during my care of the patient were reviewed by me and considered in my medical decision making (see chart for details).  Patient is a nontoxic-appearing 30 year old female here for evaluation of respiratory complaints as outlined HPI above.  On physical exam patient is pearly gray tympanic membranes  bilaterally with normal light reflex and clear external auditory canals.  Nasal mucosa is erythematous and edematous with purulent discharge in both nares.  Oropharyngeal exam is benign.  No cervical lymphadenopathy  appreciated exam.  Cardiopulmonary exam reveals inspiratory and expiratory wheezes in all lung fields.  Patient exam is consistent with an upper respiratory infection.  Due to her being a smoker and the proximal duration of symptoms we will treat her with Augmentin twice daily for 10 days, Atrovent nasal spray to help with nasal congestion, sinus irrigation, and albuterol inhaler to help her with her wheezing.  Patient vies to return for new or worsening symptoms.   Final Clinical Impressions(s) / UC Diagnoses   Final diagnoses:  Upper respiratory tract infection, unspecified type  Wheezing     Discharge Instructions      The Augmentin twice daily with food for 10 days for treatment of your URI.  Perform sinus irrigation 2-3 times a day with a NeilMed sinus rinse kit and distilled water.  Do not use tap water.  You can use plain over-the-counter Mucinex every 6 hours to break up the stickiness of the mucus so your body can clear it.  Increase your oral fluid intake to thin out your mucus so that is also able for your body to clear more easily.  Use the Atrovent nasal spray, 2 squirts in each nostril every 6 hours, as needed for runny nose and postnasal drip.  Use the Albuterol inhaler as needed for wheezing. You can use 2 puffs every 4-6 hours as needed for wheezing.   Return for reevaluation or see your primary care provider for any new or worsening symptoms.   Take an over-the-counter probiotic, such as Culturelle-align-activia, 1 hour after each dose of antibiotic to prevent diarrhea.  If you develop any new or worsening symptoms return for reevaluation or see your primary care provider.      ED Prescriptions     Medication Sig Dispense Auth. Provider   albuterol  (VENTOLIN HFA) 108 (90 Base) MCG/ACT inhaler Inhale 1-2 puffs into the lungs every 6 (six) hours as needed for wheezing or shortness of breath. 18 g Margarette Canada, NP   doxycycline (VIBRAMYCIN) 100 MG capsule Take 1 capsule (100 mg total) by mouth 2 (two) times daily. 20 capsule Margarette Canada, NP   ipratropium (ATROVENT) 0.06 % nasal spray Place 2 sprays into both nostrils 4 (four) times daily. 15 mL Margarette Canada, NP      PDMP not reviewed this encounter.   Margarette Canada, NP 11/10/21 1721

## 2023-08-16 ENCOUNTER — Other Ambulatory Visit: Payer: Self-pay | Admitting: Neurology

## 2023-08-16 DIAGNOSIS — G8929 Other chronic pain: Secondary | ICD-10-CM

## 2023-08-31 ENCOUNTER — Encounter: Payer: Self-pay | Admitting: Neurology

## 2023-09-01 ENCOUNTER — Ambulatory Visit
Admission: RE | Admit: 2023-09-01 | Discharge: 2023-09-01 | Disposition: A | Discharge status: Home / Self Care | Payer: Medicaid Other | Source: Ambulatory Visit | Attending: Neurology | Admitting: Neurology

## 2023-09-01 DIAGNOSIS — G8929 Other chronic pain: Secondary | ICD-10-CM

## 2023-10-11 ENCOUNTER — Ambulatory Visit: Payer: Self-pay | Admitting: Physician Assistant

## 2024-12-09 ENCOUNTER — Other Ambulatory Visit: Payer: Self-pay | Admitting: Physical Medicine & Rehabilitation

## 2024-12-09 DIAGNOSIS — M5416 Radiculopathy, lumbar region: Secondary | ICD-10-CM

## 2024-12-16 ENCOUNTER — Ambulatory Visit
Admission: RE | Admit: 2024-12-16 | Discharge: 2024-12-16 | Disposition: A | Source: Ambulatory Visit | Attending: Physical Medicine & Rehabilitation | Admitting: Physical Medicine & Rehabilitation

## 2024-12-16 DIAGNOSIS — M5416 Radiculopathy, lumbar region: Secondary | ICD-10-CM
